# Patient Record
Sex: Male | Born: 1960 | Race: White | Hispanic: No | Marital: Married | Smoking: Never smoker
Health system: Southern US, Community
[De-identification: ages and names within clinical notes are randomized; demographics above are authoritative.]

## PROBLEM LIST (undated history)

## (undated) DIAGNOSIS — E785 Hyperlipidemia, unspecified: Secondary | ICD-10-CM

## (undated) DIAGNOSIS — L301 Dyshidrosis [pompholyx]: Secondary | ICD-10-CM

## (undated) DIAGNOSIS — G4700 Insomnia, unspecified: Secondary | ICD-10-CM

## (undated) HISTORY — PX: HEEL SPUR SURGERY: SHX665

## (undated) HISTORY — DX: Hyperlipidemia, unspecified: E78.5

## (undated) HISTORY — PX: VASECTOMY: SHX75

## (undated) HISTORY — DX: Dyshidrosis (pompholyx): L30.1

## (undated) HISTORY — DX: Insomnia, unspecified: G47.00

---

## 2007-08-20 HISTORY — PX: SHOULDER ARTHROSCOPY: SHX128

## 2007-11-12 ENCOUNTER — Encounter: Payer: Self-pay | Admitting: Family Medicine

## 2007-12-21 ENCOUNTER — Encounter: Payer: Self-pay | Admitting: Family Medicine

## 2008-11-28 ENCOUNTER — Ambulatory Visit: Payer: Self-pay | Admitting: Family Medicine

## 2008-11-28 DIAGNOSIS — G4700 Insomnia, unspecified: Secondary | ICD-10-CM

## 2008-11-28 DIAGNOSIS — E785 Hyperlipidemia, unspecified: Secondary | ICD-10-CM

## 2008-11-28 HISTORY — DX: Hyperlipidemia, unspecified: E78.5

## 2008-11-28 HISTORY — DX: Insomnia, unspecified: G47.00

## 2008-11-29 ENCOUNTER — Telehealth: Payer: Self-pay | Admitting: Family Medicine

## 2009-01-12 ENCOUNTER — Encounter: Payer: Self-pay | Admitting: Family Medicine

## 2009-03-03 ENCOUNTER — Ambulatory Visit: Payer: Self-pay | Admitting: Family Medicine

## 2009-03-03 LAB — CONVERTED CEMR LAB
ALT: 24 units/L (ref 0–53)
Alkaline Phosphatase: 50 units/L (ref 39–117)
BUN: 12 mg/dL (ref 6–23)
Basophils Relative: 0.4 % (ref 0.0–3.0)
Bilirubin Urine: NEGATIVE
Bilirubin, Direct: 0.1 mg/dL (ref 0.0–0.3)
CO2: 29 meq/L (ref 19–32)
Chloride: 110 meq/L (ref 96–112)
Eosinophils Relative: 3.6 % (ref 0.0–5.0)
Glucose, Bld: 94 mg/dL (ref 70–99)
LDL Cholesterol: 97 mg/dL (ref 0–99)
Lymphocytes Relative: 43 % (ref 12.0–46.0)
Neutrophils Relative %: 44.9 % (ref 43.0–77.0)
Platelets: 198 10*3/uL (ref 150.0–400.0)
Potassium: 4 meq/L (ref 3.5–5.1)
RBC: 4.68 M/uL (ref 4.22–5.81)
Total Bilirubin: 0.8 mg/dL (ref 0.3–1.2)
Total CHOL/HDL Ratio: 4
Total Protein, Urine: NEGATIVE mg/dL
Total Protein: 7.2 g/dL (ref 6.0–8.3)
Urine Glucose: NEGATIVE mg/dL
VLDL: 7.8 mg/dL (ref 0.0–40.0)
WBC: 4 10*3/uL — ABNORMAL LOW (ref 4.5–10.5)
pH: 7 (ref 5.0–8.0)

## 2009-03-27 ENCOUNTER — Ambulatory Visit: Payer: Self-pay | Admitting: Family Medicine

## 2009-03-27 DIAGNOSIS — L301 Dyshidrosis [pompholyx]: Secondary | ICD-10-CM | POA: Insufficient documentation

## 2009-03-27 HISTORY — DX: Dyshidrosis (pompholyx): L30.1

## 2009-03-31 ENCOUNTER — Ambulatory Visit: Payer: Self-pay | Admitting: Family Medicine

## 2009-04-03 LAB — CONVERTED CEMR LAB
BUN: 14 mg/dL (ref 6–23)
CRP, High Sensitivity: 0.2
Ferritin: 216 ng/mL (ref 22–322)
Iron: 129 ug/dL (ref 42–165)
Magnesium: 2 mg/dL (ref 1.5–2.5)

## 2010-02-06 ENCOUNTER — Encounter: Payer: Self-pay | Admitting: Family Medicine

## 2010-05-08 ENCOUNTER — Ambulatory Visit: Payer: Self-pay | Admitting: Family Medicine

## 2010-05-08 LAB — CONVERTED CEMR LAB
Bilirubin Urine: NEGATIVE
Ketones, urine, test strip: NEGATIVE
Nitrite: NEGATIVE
Protein, U semiquant: NEGATIVE
Urobilinogen, UA: 0.2

## 2010-05-10 LAB — CONVERTED CEMR LAB
ALT: 26 units/L (ref 0–53)
Albumin: 4.3 g/dL (ref 3.5–5.2)
Basophils Relative: 0.4 % (ref 0.0–3.0)
Bilirubin, Direct: 0.2 mg/dL (ref 0.0–0.3)
CO2: 26 meq/L (ref 19–32)
Calcium: 9.5 mg/dL (ref 8.4–10.5)
Chloride: 105 meq/L (ref 96–112)
Creatinine, Ser: 0.8 mg/dL (ref 0.4–1.5)
Eosinophils Absolute: 0.1 10*3/uL (ref 0.0–0.7)
Eosinophils Relative: 2.4 % (ref 0.0–5.0)
Glucose, Bld: 95 mg/dL (ref 70–99)
HCT: 43.9 % (ref 39.0–52.0)
HDL: 42.2 mg/dL (ref >39.00)
Hemoglobin: 15 g/dL (ref 13.0–17.0)
Lymphs Abs: 1.8 10*3/uL (ref 0.7–4.0)
MCHC: 34.2 g/dL (ref 30.0–36.0)
MCV: 90.2 fL (ref 78.0–100.0)
Monocytes Absolute: 0.3 10*3/uL (ref 0.1–1.0)
Neutro Abs: 1.6 10*3/uL (ref 1.4–7.7)
Neutrophils Relative %: 41.9 % — ABNORMAL LOW (ref 43.0–77.0)
RBC: 4.87 M/uL (ref 4.22–5.81)
TSH: 0.56 microintl units/mL (ref 0.35–5.50)
Total CHOL/HDL Ratio: 4
Total Protein: 6.8 g/dL (ref 6.0–8.3)
Triglycerides: 76 mg/dL (ref 0.0–149.0)
WBC: 3.8 10*3/uL — ABNORMAL LOW (ref 4.5–10.5)

## 2010-05-14 ENCOUNTER — Telehealth: Payer: Self-pay | Admitting: *Deleted

## 2010-06-08 ENCOUNTER — Telehealth: Payer: Self-pay | Admitting: Family Medicine

## 2010-06-22 ENCOUNTER — Ambulatory Visit: Payer: Self-pay

## 2010-06-22 ENCOUNTER — Ambulatory Visit: Payer: Self-pay | Admitting: Cardiology

## 2010-06-26 ENCOUNTER — Encounter: Payer: Self-pay | Admitting: Family Medicine

## 2010-06-26 ENCOUNTER — Ambulatory Visit: Payer: Self-pay | Admitting: Family Medicine

## 2010-07-06 ENCOUNTER — Encounter (INDEPENDENT_AMBULATORY_CARE_PROVIDER_SITE_OTHER): Payer: Self-pay | Admitting: *Deleted

## 2010-09-12 ENCOUNTER — Encounter (INDEPENDENT_AMBULATORY_CARE_PROVIDER_SITE_OTHER): Payer: Self-pay | Admitting: *Deleted

## 2010-09-14 ENCOUNTER — Ambulatory Visit
Admission: RE | Admit: 2010-09-14 | Discharge: 2010-09-14 | Payer: Self-pay | Source: Home / Self Care | Attending: Internal Medicine | Admitting: Internal Medicine

## 2010-09-18 NOTE — Assessment & Plan Note (Signed)
Summary: PFT, EKG, audiometry, executive physical additions/nn  Nurse Visit   Vital Signs:  Patient profile:   50 year old male Temp:     98.0 degrees F oral  Vitals Entered By: Sid Falcon LPN (June 26, 2010 1:21 PM)  Hearing Screen  20db HL: Left  Right  Audiometry Comment: Heard all frequencies   Hearing Testing Entered By: Sid Falcon LPN (June 26, 2010 1:30 PM) 25db HL: Left  Right  Audiometry Comment: heard all frequencies  40db HL: Left  Right  Audiometry Comment: heard all frequencies    Allergies: No Known Drug Allergies  Orders Added: 1)  Audiometry [92552] 2)  Spirometry w/Graph [[address]]

## 2010-09-18 NOTE — Progress Notes (Signed)
Summary: Pt requesting additional Executive Physical testing  Phone Note Call from Patient   Caller: Patient Call For: Evelena Peat MD Summary of Call: Pt was here for CPX with Dr Caryl Never.  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  Left a message at pt home and cell phone to call and schedule these additional testing, preferable at Tuesday around noon as it is our Flex day, nurse visit with Harriett Sine Initial call taken by: Sid Falcon LPN,  June 08, 2010 3:22 PM  Follow-up for Phone Call        Pt called back, scheuled for Nov 8th Follow-up by: Sid Falcon LPN,  June 15, 2010 10:56 AM

## 2010-09-18 NOTE — Letter (Signed)
Summary: Pre Visit Letter Revised  [facility]  [address] [place], [place] [address]   Phone: [phone]  Fax: [phone]        07/06/2010 MRN: [id number] Barry King [address] [place], [place]  [address]             Procedure Date:  09/21/2010  Welcome to the Gastroenterology Division at [facility].    You are scheduled to see a nurse for your pre-procedure visit on 09/14/2010 at 4:30PM on the 3rd floor at [facility], [address]  We ask that you try to arrive at our office 15 minutes prior to your appointment time to allow for check-in.  Please take a minute to review the attached form.  If you answer "Yes" to one or more of the questions on the first page, we ask that you call the person listed at your earliest opportunity.  If you answer "No" to all of the questions, please complete the rest of the form and bring it to your appointment.    Your nurse visit will consist of discussing your medical and surgical history, your immediate family medical history, and your medications.   If you are unable to list all of your medications on the form, please bring the medication bottles to your appointment and we will list them.  We will need to be aware of both prescribed and over the counter drugs.  We will need to know exact dosage information as well.    Please be prepared to read and sign documents such as consent forms, a financial agreement, and acknowledgement forms.  If necessary, and with your consent, a friend or relative is welcome to sit-in on the nurse visit with you.  Please bring your insurance card so that we may make a copy of it.  If your insurance requires a referral to see a specialist, please bring your referral form from your primary care physician.  No co-pay is required for this nurse visit.     If you cannot keep your appointment, please call [phone] to cancel or reschedule prior to your appointment date.  This  allows [place] the opportunity to schedule an appointment for another patient in need of care.    Thank you for choosing [facility] for your medical needs.  We appreciate the opportunity to care for you.  Please visit [place] at our website  to learn more about our practice.  Sincerely, The Gastroenterology Division

## 2010-09-18 NOTE — Progress Notes (Signed)
Summary: lab copy  Phone Note Call from Patient Call back at [phone]   Call For: Mariam Helbert Summary of Call: Wants copy of labs faxed [phone] or mailed.  Got results Fri, but he wants to see actual numbers.   Initial call taken by: Rudy Jew, RN,  May 14, 2010 8:08 AM  Follow-up for Phone Call        Labs will be mailed as requested Follow-up by: Sid Falcon LPN,  May 14, 2010 8:56 AM

## 2010-09-18 NOTE — Assessment & Plan Note (Signed)
Summary: CPX/PT WILL COME IN FASTING/NJR   Vital Signs:  Patient profile:   50 year old male Height:      77 inches Weight:      203 pounds BMI:     24.16 O2 Sat:      98 % Temp:     98.2 degrees F oral Pulse rate:   61 / minute Pulse rhythm:   regular Resp:     12 per minute BP sitting:   120 / 78  Vitals Entered By: Lynann Beaver CMA (May 08, 2010 9:00 AM)  CC: cpx Is Patient Diabetic? No Pain Assessment Patient in pain? no        History of Present Illness: Here for CPE.  Has made significant lifestyle changes since last visit-predominantly with more exercise.  Very stressfull job.  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  Handling stress more effectively.  Still has some sleep difficulty at times.  Signif travel related to work.  He brings in copy of recommended items for "executive physical" that his company recommends.  This includes ETT, audiometry, EKG, Spirometry, Colonoscopy, in addition to  the usual labs.  Clinical Review Panels:  Prevention   Last PSA:  0.6 (12/21/2007)  Immunizations   Last Tetanus Booster:  Historical (08/19/2004)  Lipid Management   Cholesterol:  144 (03/03/2009)   LDL (bad choesterol):  97 (03/03/2009)   HDL (good cholesterol):  39.00 (03/03/2009)  Diabetes Management   Creatinine:  0.8 (03/03/2009)  CBC   WBC:  4.0 (03/03/2009)   RBC:  4.68 (03/03/2009)   Hgb:  14.6 (03/03/2009)   Hct:  41.9 (03/03/2009)   Platelets:  198.0 (03/03/2009)   MCV  89.5 (03/03/2009)   MCHC  34.7 (03/03/2009)   RDW  12.6 (03/03/2009)   PMN:  44.9 (03/03/2009)   Lymphs:  43.0 (03/03/2009)   Monos:  8.1 (03/03/2009)   Eosinophils:  3.6 (03/03/2009)   Basophil:  0.4 (03/03/2009)  Complete Metabolic Panel   Glucose:  94 (03/03/2009)   Sodium:  143 (03/03/2009)   Potassium:  4.0 (03/03/2009)   Chloride:  110 (03/03/2009)   CO2:  29 (03/03/2009)   BUN:  14 (03/31/2009)   Creatinine:  0.8 (03/03/2009)   Albumin:  4.1 (03/03/2009)   Total  Protein:  7.2 (03/03/2009)   Calcium:  9.2 (03/03/2009)   Total Bili:  0.8 (03/03/2009)   Alk Phos:  50 (03/03/2009)   SGPT (ALT):  24 (03/03/2009)   SGOT (AST):  18 (03/03/2009)   Current Medications (verified): 1)  Lipitor 10 Mg Tabs (Atorvastatin Calcium) .... One Tab Daily 2)  Zolpidem Tartrate 10 Mg Tabs (Zolpidem Tartrate) .... One By Mouth Q Hs As Needed Insomnia  Allergies (verified): No Known Drug Allergies  Past History:  Past Medical History: Last updated: 01/12/2009 Hyperlipidemia Kidney stones chronic intermittent insomnia  Past Surgical History: Last updated: 03/27/2009 Left shoulder, arthroscopic 2009 Right knee, arthroscopic 2007 Vasectomy right foot spur 2010  Family History: Last updated: 05/08/2010 Family History of Cardiovascular disorder Prostate CA, father MI 30 Family History Uterine cancer  mother stroke--mother age 34  Social History: Last updated: 01/12/2009 Occupation:  Holiday representative VP Married Alcohol use-yes Regular exercise-yes Never Smoked  Risk Factors: Exercise: yes (11/28/2008)  Risk Factors: Smoking Status: never (01/12/2009) PMH-FH-SH reviewed for relevance  Family History: Family History of Cardiovascular disorder Prostate CA, father MI 73 Family History Uterine cancer  mother stroke--mother age 85  Review of Systems  The patient denies anorexia, fever, weight gain, vision loss,  decreased hearing, hoarseness, chest pain, syncope, dyspnea on exertion, peripheral edema, prolonged cough, headaches, hemoptysis, abdominal pain, melena, hematochezia, severe indigestion/heartburn, hematuria, incontinence, muscle weakness, suspicious skin lesions, depression, and enlarged lymph nodes.         Has lost some weight sec to his efforts.  Physical Exam  General:  Well-developed,well-nourished,in no acute distress; alert,appropriate and cooperative throughout examination Eyes:  pupils equal, pupils round, and pupils reactive to light.    Ears:  External ear exam shows no significant lesions or deformities.  Otoscopic examination reveals clear canals, tympanic membranes are intact bilaterally without bulging, retraction, inflammation or discharge. Hearing is grossly normal bilaterally. Mouth:  Oral mucosa and oropharynx without lesions or exudates.  Teeth in good repair. Neck:  No deformities, masses, or tenderness noted. Lungs:  Normal respiratory effort, chest expands symmetrically. Lungs are clear to auscultation, no crackles or wheezes. Heart:  Normal rate and regular rhythm. S1 and S2 normal without gallop, murmur, click, rub or other extra sounds. Abdomen:  Bowel sounds positive,abdomen soft and non-tender without masses, organomegaly or hernias noted. Rectal:  No external abnormalities noted. Normal sphincter tone. No rectal masses or tenderness. Prostate:  Prostate gland firm and smooth, no enlargement, nodularity, tenderness, mass, asymmetry or induration. Msk:  No deformity or scoliosis noted of thoracic or lumbar spine.   Extremities:  No clubbing, cyanosis, edema, or deformity noted with normal full range of motion of all joints.   Neurologic:  alert & oriented X3, cranial nerves II-XII intact, strength normal in all extremities, and gait normal.   Skin:  no rashes and no suspicious lesions.   Cervical Nodes:  No lymphadenopathy noted Psych:  normally interactive, good eye contact, not anxious appearing, and not depressed appearing.     Impression & Recommendations:  Problem # 1:  ROUTINE GENERAL MEDICAL EXAM@HEALTH  CARE FACL (ICD-V70.0) schedule screening labs.  Set up colonoscopy after he turn 49 in Nov.  Other testing as per HPI. Orders: TLB-Lipid Panel (80061-LIPID) TLB-BMP (Basic Metabolic Panel-BMET) (80048-METABOL) TLB-CBC Platelet - w/Differential (85025-CBCD) TLB-Hepatic/Liver Function Pnl (80076-HEPATIC) TLB-TSH (Thyroid Stimulating Hormone) (84443-TSH) UA Dipstick w/o Micro (automated)   (81003) TLB-PSA (Prostate Specific Antigen) (84153-PSA) Specimen Handling ([address]) Venipuncture ([address]) Gastroenterology Referral (GI) Cardiology Referral (Cardiology)  Complete Medication List: 1)  Lipitor 10 Mg Tabs (Atorvastatin calcium) .... One tab daily 2)  Zolpidem Tartrate 10 Mg Tabs (Zolpidem tartrate) .... One by mouth q hs as needed insomnia Prescriptions: ZOLPIDEM TARTRATE 10 MG TABS (ZOLPIDEM TARTRATE) one by mouth q hs as needed insomnia  #30 x 2   Entered and Authorized by:   Evelena Peat MD   Signed by:   Evelena Peat MD on 05/08/2010   Method used:   Print then Give to Patient   RxID:   [id number] LIPITOR 10 MG TABS (ATORVASTATIN CALCIUM) one tab daily  #90 Tablet x 3   Entered and Authorized by:   Evelena Peat MD   Signed by:   Evelena Peat MD on 05/08/2010   Method used:   Electronically to        [facility]* (retail)             ,          Ph: [phone]       Fax: [phone]   RxID:   [id number]    Laboratory Results   Urine Tests  Date/Time Recieved: May 08, 2010 10:50 AM  Date/Time Reported: May 08, 2010 10:50 AM   Routine Urinalysis  Color: yellow Appearance: Clear Glucose: negative   (Normal Range: Negative) Bilirubin: negative   (Normal Range: Negative) Ketone: negative   (Normal Range: Negative) Spec. Gravity: 1.010   (Normal Range: 1.003-1.035) Blood: negative   (Normal Range: Negative) pH: 7.5   (Normal Range: 5.0-8.0) Protein: negative   (Normal Range: Negative) Urobilinogen: 0.2   (Normal Range: 0-1) Nitrite: negative   (Normal Range: Negative) Leukocyte Esterace: trace   (Normal Range: Negative)    Comments: Wynona Canes, CMA  May 08, 2010 10:50 AM

## 2010-09-18 NOTE — Letter (Signed)
Summary: Generic Letter  [place] at [facility]  [address] [place], [place] [address]   Phone: [phone]  Fax: [phone]    06/26/2010  Regarding:  Barry King [address] [place], [place]  [address]  To Whom it May Concern,  Barry King had a recent complete physical examination through our office.  Screening labs including chemistries, CBC, PSA, TSH, urinalysis, and hepatic function were basically normal.  Spirometry, audiometry, EKG, and exercise tolerance tests were all normal.  Colonoscopy has been scheduled.      Sincerely,   Evelena Peat MD

## 2010-09-18 NOTE — Therapy (Signed)
Summary: Hearing Test/[facility]  Hearing Test/[facility]   Imported By: Maryln Gottron 07/02/2010 10:47:26  _____________________________________________________________________  External Attachment:    Type:   Image     Comment:   External Document

## 2010-09-20 NOTE — Miscellaneous (Signed)
Summary: previsit prep/rm  Clinical Lists Changes  Medications: Added new medication of MOVIPREP 100 GM  SOLR (PEG-KCL-NACL-NASULF-NA ASC-C) As per prep instructions. - Signed Rx of MOVIPREP 100 GM  SOLR (PEG-KCL-NACL-NASULF-NA ASC-C) As per prep instructions.;  #1 x 0;  Signed;  Entered by: Sherren Kerns RN;  Authorized by: Hilarie Fredrickson MD;  Method used: [facility]  [place] [address], [place], [place]  [address], Ph: [phone] or [phone], Fax: [phone] Observations: Added new observation of ALLERGY REV: Done (09/14/2010 16:01) Added new observation of NKA: T (09/14/2010 16:01)    Prescriptions: MOVIPREP 100 GM  SOLR (PEG-KCL-NACL-NASULF-NA ASC-C) As per prep instructions.  #1 x 0   Entered by:   Sherren Kerns RN   Authorized by:   Hilarie Fredrickson MD   Signed by:   Sherren Kerns RN on 09/14/2010   Method used:   [facility]        [facility]  [place] [address] (retail)       [address] [place] [address]       [place], [place]  [address]       Ph: [phone] or [phone]       Fax: [phone]   RxID:   [id number]

## 2010-09-20 NOTE — Letter (Signed)
Summary: [facility] Instructions  [facility]  [address] [place], [place] [address]   Phone: [phone]  Fax: [phone]       DAVIEON STOCKHAM    01/24/1961    MRN: [id number]        Procedure Day /Date: Friday 09/21/10     Arrival Time: 7:30 am      Procedure Time: 8:30 am     Location of Procedure:                    _X_  [facility] (4th Floor)       PREPARATION FOR COLONOSCOPY WITH MOVIPREP   Starting 5 days prior to your procedure  Sunday 09/16/10  do not eat nuts, seeds, popcorn, corn, beans, peas,  salads, or any raw vegetables.  Do not take any fiber supplements (e.g. Metamucil, Citrucel, and Benefiber).  THE DAY BEFORE YOUR PROCEDURE         DATE: 09/20/10    DAY: Thursday    1.  Drink clear liquids the entire day-NO SOLID FOOD  2.  Do not drink anything colored red or purple.  Avoid juices with pulp.  No orange juice.  3.  Drink at least 64 oz. (8 glasses) of fluid/clear liquids during the day to prevent dehydration and help the prep work efficiently.  CLEAR LIQUIDS INCLUDE: Water Jello Ice Popsicles Tea (sugar ok, no milk/cream) Powdered fruit flavored drinks Coffee (sugar ok, no milk/cream) Gatorade Juice: apple, white grape, white cranberry  Lemonade Clear bullion, consomm, broth Carbonated beverages (any kind) Strained chicken noodle soup Hard Candy                             4.  In the morning, mix first dose of MoviPrep solution:    Empty 1 Pouch A and 1 Pouch B into the disposable container    Add lukewarm drinking water to the top line of the container. Mix to dissolve    Refrigerate (mixed solution should be used within 24 hrs)  5.  Begin drinking the prep at 5:00 p.m. The MoviPrep container is divided by 4 marks.   Every 15 minutes drink the solution down to the next mark (approximately 8 oz) until the full liter is complete.   6.  Follow completed prep with 16 oz of clear liquid of your choice (Nothing red or  purple).  Continue to drink clear liquids until bedtime.  7.  Before going to bed, mix second dose of MoviPrep solution:    Empty 1 Pouch A and 1 Pouch B into the disposable container    Add lukewarm drinking water to the top line of the container. Mix to dissolve    Refrigerate  THE DAY OF YOUR PROCEDURE      DATE: 09/21/10   DAY: Friday   Beginning at 3:30 a.m. (5 hours before procedure):         1. Every 15 minutes, drink the solution down to the next mark (approx 8 oz) until the full liter is complete.  2. Follow completed prep with 16 oz. of clear liquid of your choice.    3. You may drink clear liquids until 6:30 am  (2 HOURS BEFORE PROCEDURE).   MEDICATION INSTRUCTIONS  Unless otherwise instructed, you should take regular prescription medications with a small sip of water   as early as possible the morning of your procedure.  OTHER INSTRUCTIONS  You will need a responsible adult at least 51 years of age to accompany you and drive you home.   This person must remain in the waiting room during your procedure.  Wear loose fitting clothing that is easily removed.  Leave jewelry and other valuables at home.  However, you may wish to bring a book to read or  an iPod/MP3 player to listen to music as you wait for your procedure to start.  Remove all body piercing jewelry and leave at home.  Total time from sign-in until discharge is approximately 2-3 hours.  You should go home directly after your procedure and rest.  You can resume normal activities the  day after your procedure.  The day of your procedure you should not:   Drive   Make legal decisions   Operate machinery   Drink alcohol   Return to work  You will receive specific instructions about eating, activities and medications before you leave.    The above instructions have been reviewed and explained to me by  Sherren Kerns RN  September 14, 2010 4:40 PM    I fully understand and can  verbalize these instructions _____________________________ Date _________

## 2010-09-21 ENCOUNTER — Other Ambulatory Visit (AMBULATORY_SURGERY_CENTER): Payer: BC Managed Care – PPO | Admitting: Internal Medicine

## 2010-09-21 ENCOUNTER — Other Ambulatory Visit: Payer: Self-pay | Admitting: Internal Medicine

## 2010-09-21 ENCOUNTER — Ambulatory Visit: Admit: 2010-09-21 | Payer: Self-pay | Admitting: Internal Medicine

## 2010-09-21 DIAGNOSIS — D126 Benign neoplasm of colon, unspecified: Secondary | ICD-10-CM

## 2010-09-21 DIAGNOSIS — K5730 Diverticulosis of large intestine without perforation or abscess without bleeding: Secondary | ICD-10-CM

## 2010-09-21 DIAGNOSIS — Z1211 Encounter for screening for malignant neoplasm of colon: Secondary | ICD-10-CM

## 2010-09-25 ENCOUNTER — Encounter: Payer: Self-pay | Admitting: Internal Medicine

## 2010-10-04 NOTE — Letter (Signed)
Summary: Patient Notice- Polyp Results  [facility]  [address] [place], [place] [address]   Phone: [phone]  Fax: [phone]        September 25, 2010 MRN: [id number]    [facility] [address] [place], [place]  [address]    Dear Mr. Scala,  I am pleased to inform you that the colon polyp(s) removed during your recent colonoscopy was (were) found to be benign (no cancer detected) upon pathologic examination.  I recommend you have a repeat colonoscopy examination in 5 years to look for recurrent polyps, as having colon polyps increases your risk for having recurrent polyps or even colon cancer in the future.  Should you develop new or worsening symptoms of abdominal pain, bowel habit changes or bleeding from the rectum or bowels, please schedule an evaluation with either your primary care physician or with me.  Additional information/recommendations:  __ No further action with gastroenterology is needed at this time. Please      follow-up with your primary care physician for your other healthcare      needs.   Please call us if you are having persistent problems or have questions about your condition that have not been fully answered at this time.  Sincerely,  Hilarie Fredrickson MD  This letter has been electronically signed by your physician.  Appended Document: Patient Notice- Polyp Results LETTER MAILED

## 2010-10-04 NOTE — Procedures (Addendum)
Summary: Colonoscopy   Colonoscopy  Procedure date:  09/21/2010  Findings:      Location:  [facility].   COLONOSCOPY PROCEDURE REPORT  PATIENT:  Barry King, Barry King  MR#:  [id number] BIRTHDATE:   [birth year], 49 yrs. old   GENDER:   male ENDOSCOPIST:   Wilhemina Bonito. Eda Keys, MD REF. BY: Evelena Peat, M.D. PROCEDURE DATE:  09/21/2010 PROCEDURE:  Colonoscopy with snare polypectomy x 4 ASA CLASS:   Class II INDICATIONS: Routine Risk Screening  MEDICATIONS:    Fentanyl 100 mcg IV, Versed 10 mg IV, Benadryl 12.5 mg IV  DESCRIPTION OF PROCEDURE:   After the risks benefits and alternatives of the procedure were thoroughly explained, informed consent was obtained.  Digital rectal exam was performed and revealed no abnormalities.   The LB 180AL [id number] endoscope was introduced through the anus and advanced to the cecum, which was identified by both the appendix and ileocecal valve, without limitations.Time to cecum = 4:07 min.  The quality of the prep was excellent, using MoviPrep.  The instrument was then slowly withdrawn (time = 12:53 min) as the colon was fully examined. <<PROCEDUREIMAGES>>            <<OLD IMAGES>>  FINDINGS:  Four polyps were found - 4mm and 8mm sessile in the ascending colon; 5mm pedunculated in the descending colon, and 5mm in the sigmoid colon. Polyps were snared without cautery. Retrieval was successful.   Mild diverticulosis was found in the sigmoid colon.  Otherwise normal colonoscopy without other polyps, masses, vascular ectasias, or inflammatory changes.   Retroflexed views in the rectum revealed no abnormalities.    The scope was then withdrawn from the patient and the procedure completed.  COMPLICATIONS:   None ENDOSCOPIC IMPRESSION:  1) Four polyps - removed  2) Mild diverticulosis in the sigmoid colon  3) Otherwise nl colonoscopy   RECOMMENDATIONS:  1) Follow up colonoscopy in 3 years if 3 or more adenomas; otherwise 5  years _______________________________ Wilhemina Bonito. Eda Keys, MD  CC: Evelena Peat, MD; The Patient     Appended Document: Colonoscopy recall     Procedures Next Due Date:    Colonoscopy: 09/2015

## 2010-10-17 LAB — HM COLONOSCOPY

## 2010-11-17 ENCOUNTER — Emergency Department (HOSPITAL_COMMUNITY): Payer: BC Managed Care – PPO

## 2010-11-17 ENCOUNTER — Emergency Department (HOSPITAL_COMMUNITY)
Admission: EM | Admit: 2010-11-17 | Discharge: 2010-11-17 | Disposition: A | Discharge status: Home / Self Care | Payer: BC Managed Care – PPO | Attending: Emergency Medicine | Admitting: Emergency Medicine

## 2010-11-17 DIAGNOSIS — S62233A Other displaced fracture of base of first metacarpal bone, unspecified hand, initial encounter for closed fracture: Secondary | ICD-10-CM | POA: Insufficient documentation

## 2010-11-17 DIAGNOSIS — W64XXXA Exposure to other animate mechanical forces, initial encounter: Secondary | ICD-10-CM | POA: Insufficient documentation

## 2010-11-17 DIAGNOSIS — E7800 Pure hypercholesterolemia, unspecified: Secondary | ICD-10-CM | POA: Insufficient documentation

## 2010-11-21 ENCOUNTER — Ambulatory Visit (HOSPITAL_BASED_OUTPATIENT_CLINIC_OR_DEPARTMENT_OTHER)
Admission: RE | Admit: 2010-11-21 | Discharge: 2010-11-21 | Disposition: A | Discharge status: Home / Self Care | Payer: BC Managed Care – PPO | Source: Ambulatory Visit | Attending: Orthopedic Surgery | Admitting: Orthopedic Surgery

## 2010-11-21 DIAGNOSIS — S62233A Other displaced fracture of base of first metacarpal bone, unspecified hand, initial encounter for closed fracture: Secondary | ICD-10-CM | POA: Insufficient documentation

## 2010-11-21 DIAGNOSIS — X58XXXA Exposure to other specified factors, initial encounter: Secondary | ICD-10-CM | POA: Insufficient documentation

## 2010-11-21 LAB — POCT HEMOGLOBIN-HEMACUE: Hemoglobin: 15.5 g/dL (ref 13.0–17.0)

## 2010-11-29 NOTE — Op Note (Signed)
  NAMEBUCKY, Barry King NO.:  [id number]  MEDICAL RECORD NO.:  [id number]           PATIENT TYPE:  LOCATION:                                 FACILITY:  PHYSICIAN:  Artist Pais. Yannet Rincon, M.D.DATE OF BIRTH:  [birth year]  DATE OF PROCEDURE:  11/21/2010 DATE OF DISCHARGE:                              OPERATIVE REPORT   PREOPERATIVE DIAGNOSIS:  Displaced thumb metacarpal fracture, left side.  POSTOPERATIVE DIAGNOSIS:  Displaced thumb metacarpal fracture, left side.  PROCEDURE:  Closed reduction and percutaneous pinning above with 0.045 K- wires x3.  SURGEON:  Artist Pais. Mina Marble, MD  ASSISTANT:  None.  ANESTHESIA:  General.  TOURNIQUET TIME:  Eighty minutes.  No complication.  No drains.  The patient was taken to the operating suite.  After the induction of adequate general anesthesia, left upper extremity was prepped and draped in sterile fashion.  An Esmarch was used to exsanguinate the limb. Tourniquet was then inflated to 250 mmHg.  At this point in time, closed reduction was performed by longitudinal traction, pronation, and downward pressure on the thumb metacarpal base.  Once reduction was obtained, an 0.045 K-wires driven from radial to ulnar across the fracture site under direct and fluoroscopic guidance, a second was placed radially, and a third one ulnarly, all across the fracture site but not crossing the [facility] joint.  Intraoperative fluoroscopy revealed adequate reduction in AP, lateral, and oblique view.  The K-wire was cut outside the skin, bent upon themselves, dressed with Xeroform.  The patient was placed in a sterile dressing of 4 x 4 fluffs and a radial gutter splint.  The patient tolerated the procedure well and went to recovery in stable fashion.     Artist Pais Mina Marble, M.D.     MAW/MEDQ  D:  11/21/2010  T:  11/22/2010  Job:  [id number]  Electronically Signed by Dairl Ponder M.D. on 11/29/2010 03:06:43 PM

## 2011-04-15 ENCOUNTER — Other Ambulatory Visit: Payer: Self-pay | Admitting: *Deleted

## 2011-04-15 NOTE — Telephone Encounter (Signed)
OK to refill ambien #90 once

## 2011-04-15 NOTE — Telephone Encounter (Signed)
Note from pt requesting refill from Ambien 10mg . to [facility], requesting #90 with year refills.  Pt last OV was 9/11.  I spoke with pt, reminded him of need for return OV, and asked him for a local pharmacy to call in #30.  Pt explained his insurance would not be happy? Requesting OK to fill Ambien #90, 0 refills for [facility] and pt agreed to schedule return OV soon.

## 2011-04-16 MED ORDER — ZOLPIDEM TARTRATE 10 MG PO TABS: 10.0000 mg | ORAL_TABLET | Freq: Every evening | ORAL | Status: DC | PRN

## 2011-04-16 NOTE — Telephone Encounter (Signed)
Pt wife aware #90 sent to [facility] and he will need to be seen for future refills KIK

## 2011-04-18 ENCOUNTER — Other Ambulatory Visit: Payer: Self-pay | Admitting: *Deleted

## 2011-04-18 MED ORDER — ZOLPIDEM TARTRATE 10 MG PO TABS: 10.0000 mg | ORAL_TABLET | Freq: Every evening | ORAL | Status: DC | PRN

## 2011-04-18 NOTE — Telephone Encounter (Signed)
Per Dr Caryl Never, OK to fill Ambien 10 mg #90 to [facility], 0 refills as pt does need return OV.  Rx faxed, confirmation received, pt wife informed

## 2011-06-17 ENCOUNTER — Ambulatory Visit (INDEPENDENT_AMBULATORY_CARE_PROVIDER_SITE_OTHER): Payer: BC Managed Care – PPO | Admitting: Family Medicine

## 2011-06-17 ENCOUNTER — Encounter: Payer: Self-pay | Admitting: Family Medicine

## 2011-06-17 VITALS — BP 128/76 | HR 60 | Temp 98.5°F | Resp 12 | Ht 77.0 in | Wt 205.0 lb

## 2011-06-17 DIAGNOSIS — Z0000 Encounter for general adult medical examination without abnormal findings: Secondary | ICD-10-CM

## 2011-06-17 LAB — CBC WITH DIFFERENTIAL/PLATELET
Basophils Absolute: 0 10*3/uL (ref 0.0–0.1)
Basophils Relative: 0.3 % (ref 0.0–3.0)
HCT: 41.5 % (ref 39.0–52.0)
Hemoglobin: 14.2 g/dL (ref 13.0–17.0)
Lymphs Abs: 1.7 10*3/uL (ref 0.7–4.0)
MCHC: 34.2 g/dL (ref 30.0–36.0)
Monocytes Relative: 7.6 % (ref 3.0–12.0)
Neutro Abs: 1.6 10*3/uL (ref 1.4–7.7)
RDW: 13.4 % (ref 11.5–14.6)

## 2011-06-17 LAB — LIPID PANEL
Cholesterol: 139 mg/dL (ref 0–200)
Total CHOL/HDL Ratio: 4
Triglycerides: 38 mg/dL (ref 0.0–149.0)

## 2011-06-17 LAB — HEPATIC FUNCTION PANEL
ALT: 22 U/L (ref 0–53)
Albumin: 4.2 g/dL (ref 3.5–5.2)
Alkaline Phosphatase: 48 U/L (ref 39–117)
Total Protein: 6.6 g/dL (ref 6.0–8.3)

## 2011-06-17 LAB — BASIC METABOLIC PANEL
Calcium: 9.3 mg/dL (ref 8.4–10.5)
Creatinine, Ser: 0.8 mg/dL (ref 0.4–1.5)

## 2011-06-17 MED ORDER — ATORVASTATIN CALCIUM 10 MG PO TABS: 10.0000 mg | ORAL_TABLET | Freq: Every day | ORAL | Status: DC

## 2011-06-17 NOTE — Progress Notes (Signed)
  Subjective:    Patient ID: Barry King, male    DOB: Apr 02, 1961, 50 y.o.   MRN: [id number]  HPI  Patient is seen for complete physical. Continues to exercise regularly. Takes Lipitor for hyperlipidemia but no other regular medications. Immunizations reviewed and up-to-date. Very stressful work in the past year. 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 Had colonoscopy last year or which showed benign polyps.  Past Medical History  Diagnosis Date  . HYPERLIPIDEMIA 11/28/2008  . 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 Shoulder arthroscopy 2009  . Vasectomy   . Heel spur surgery     reports that he has never smoked. He does not have any smokeless tobacco history on file. His alcohol and drug histories not on file. family history includes Cancer in his father and mother; Heart disease in his father; and Stroke (age of onset:85) in his mother. No Known Allergies   Review of Systems  Constitutional: Negative for fever, activity change, appetite change and fatigue.  HENT: Negative for ear pain, congestion and trouble swallowing.   Eyes: Negative for pain and visual disturbance.  Respiratory: Negative for cough, shortness of breath and wheezing.   Cardiovascular: Negative for chest pain and palpitations.  Gastrointestinal: Negative for nausea, vomiting, abdominal pain, diarrhea, constipation, blood in stool, abdominal distention and rectal pain.  Genitourinary: Negative for dysuria, hematuria and testicular pain.  Musculoskeletal: Negative for joint swelling and arthralgias.  Skin: Negative for rash.  Neurological: Negative for dizziness, syncope and headaches.  Hematological: Negative for adenopathy.  Psychiatric/Behavioral: Negative for confusion and dysphoric mood.       Objective:   Physical Exam  Constitutional: He is oriented to person, place, and time. He appears well-developed and  well-nourished. No distress.  HENT:  Head: Normocephalic and atraumatic.  Right Ear: External ear normal.  Left Ear: External ear normal.  Mouth/Throat: Oropharynx is clear and moist.  Eyes: Conjunctivae and EOM are normal. Pupils are equal, round, and reactive to light.  Neck: Normal range of motion. Neck supple. No thyromegaly present.  Cardiovascular: Normal rate, regular rhythm and normal heart sounds.   No murmur heard. Pulmonary/Chest: No respiratory distress. He has no wheezes. He has no rales.  Abdominal: Soft. Bowel sounds are normal. He exhibits no distension and no mass. There is no tenderness. There is no rebound and no guarding.  Genitourinary: Rectum normal and prostate normal.  Musculoskeletal: He exhibits no edema.  Lymphadenopathy:    He has no cervical adenopathy.  Neurological: He is alert and oriented to person, place, and time. He displays normal reflexes. No cranial nerve deficit.  Skin: No rash noted.  Psychiatric: He has a normal mood and affect. His behavior is normal. Judgment and thought content normal.          Assessment & Plan:  Complete physical. Obtain screening lab work. Patient given names of a couple of therapists to discuss stress management with.  Continue consistent exercise.

## 2011-06-18 NOTE — Progress Notes (Signed)
Quick Note:  Labs mailed to pt home upon request at [facility] ______

## 2011-08-22 IMAGING — CR DG RIBS W/ CHEST 3+V*R*
3 series · 3 of 3 positions shown · non-contrast
Comparison: None.

CLINICAL DATA: Blunt trauma.  Right anterior and inferior rib pain.

RIGHT RIBS AND CHEST - 3+ VIEW

[w chest pa]
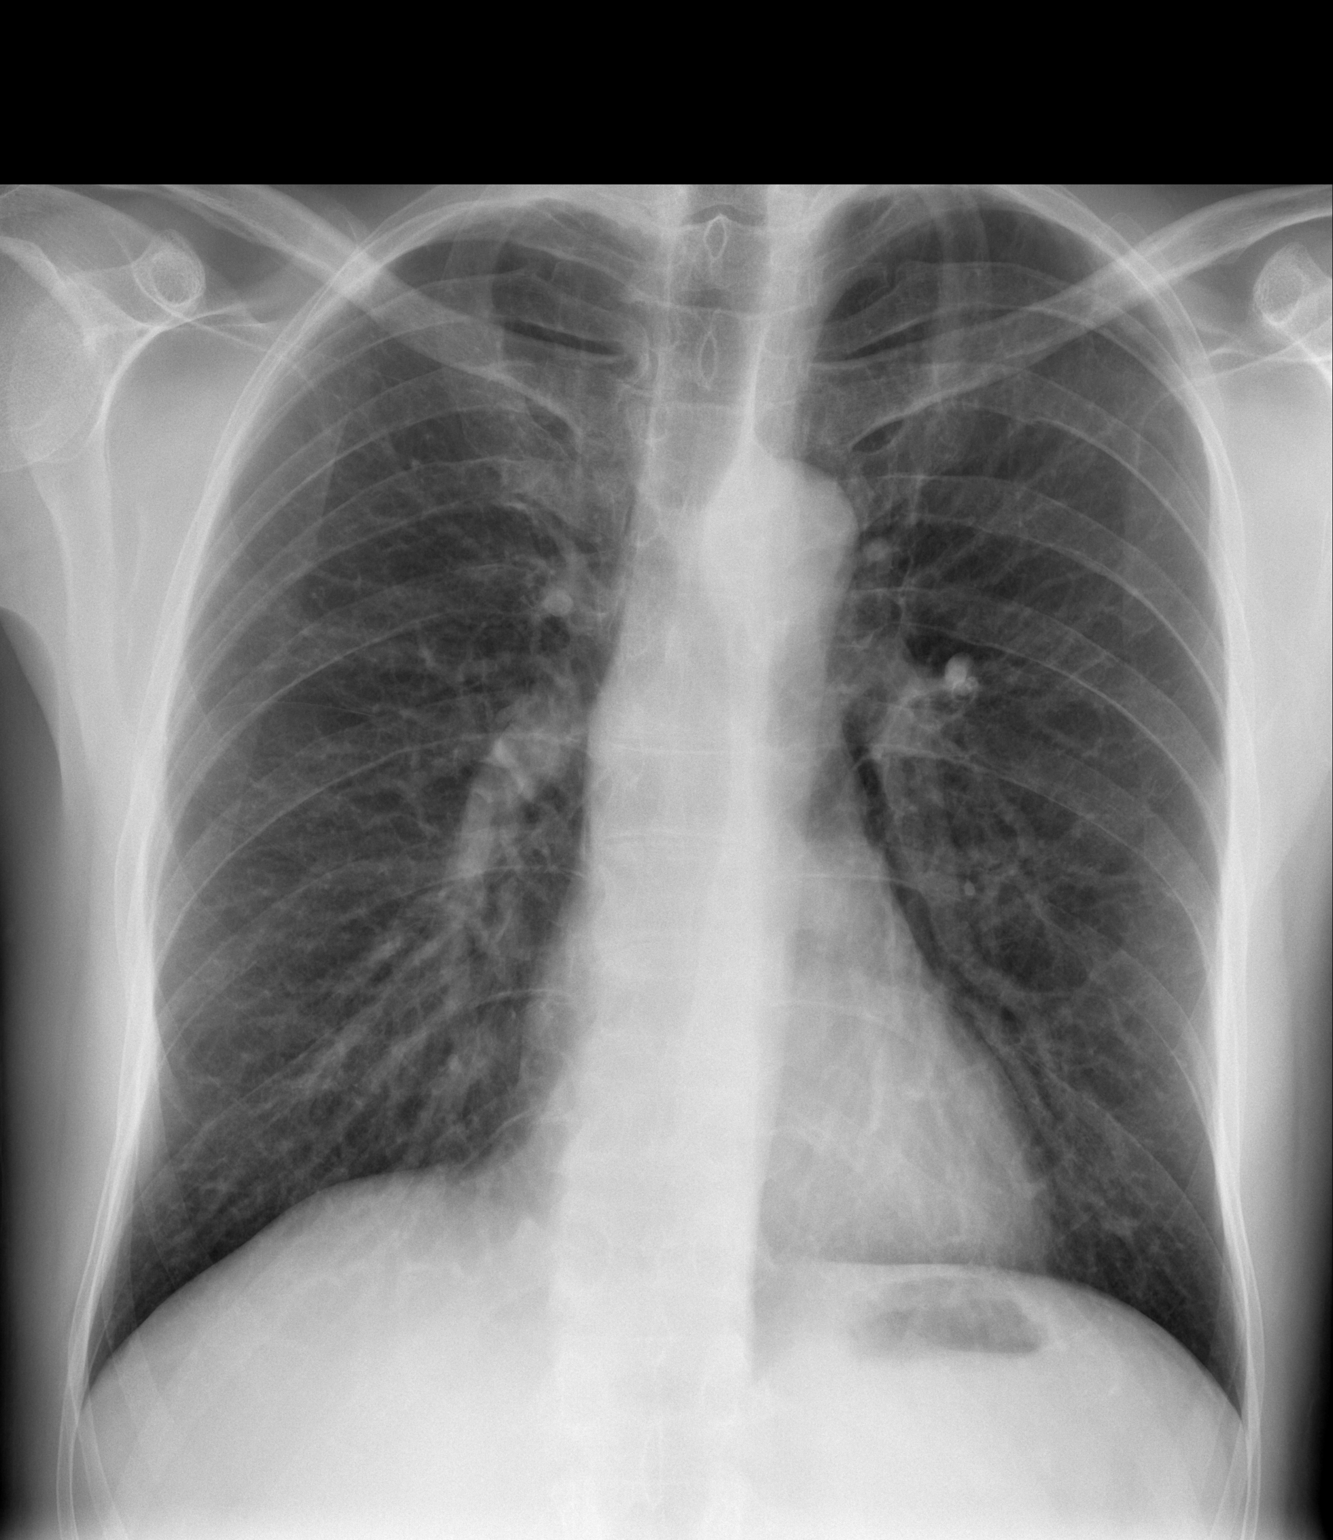

[w ribs ap/pa upper right]
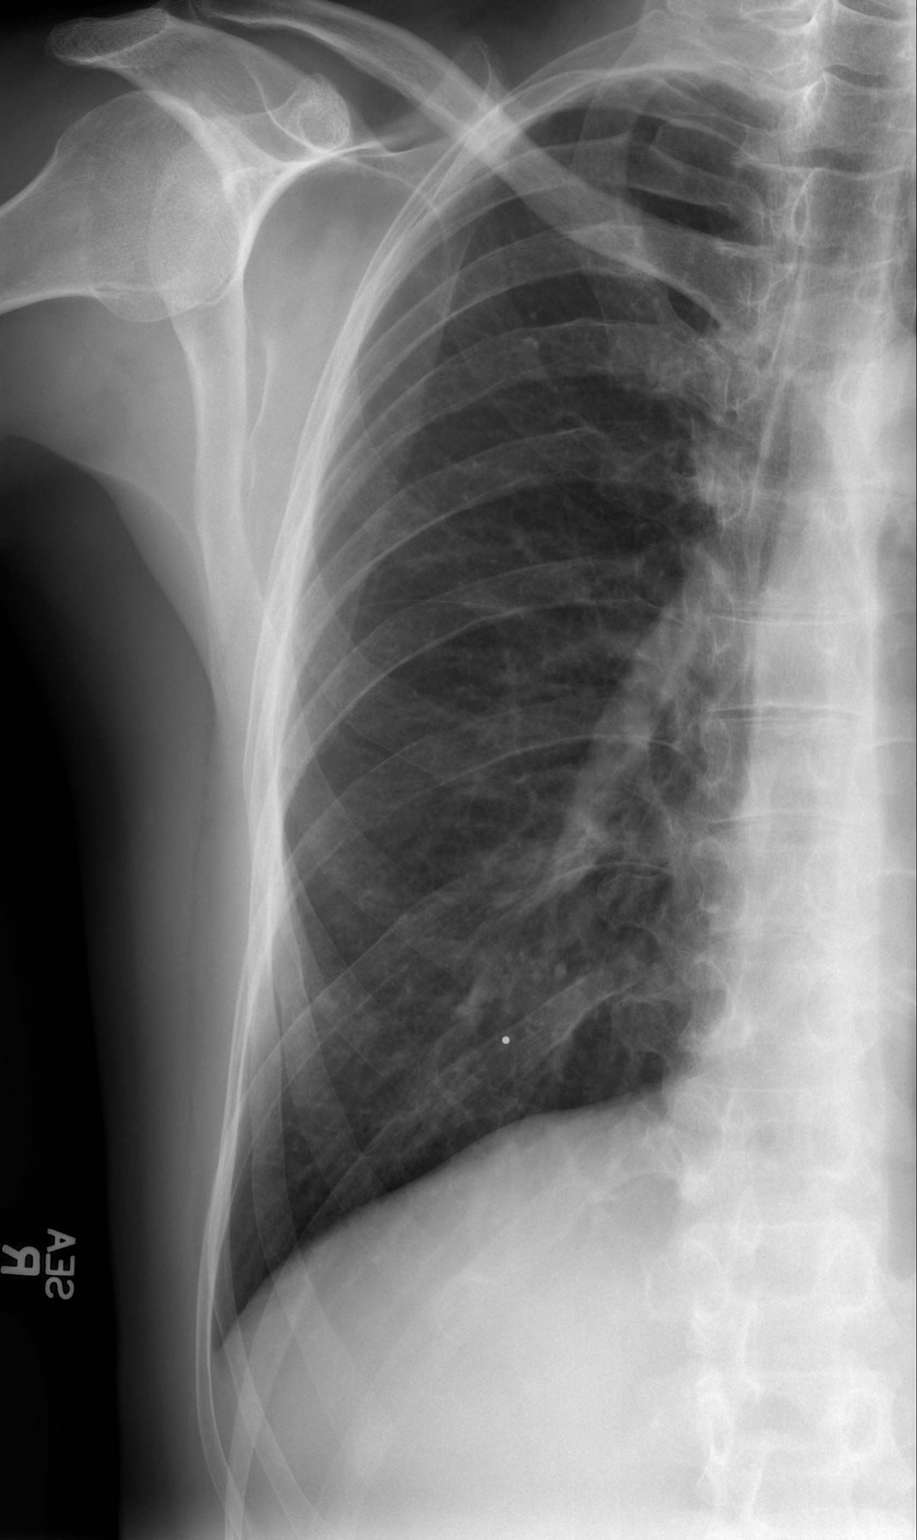

[w ribs oblique right]
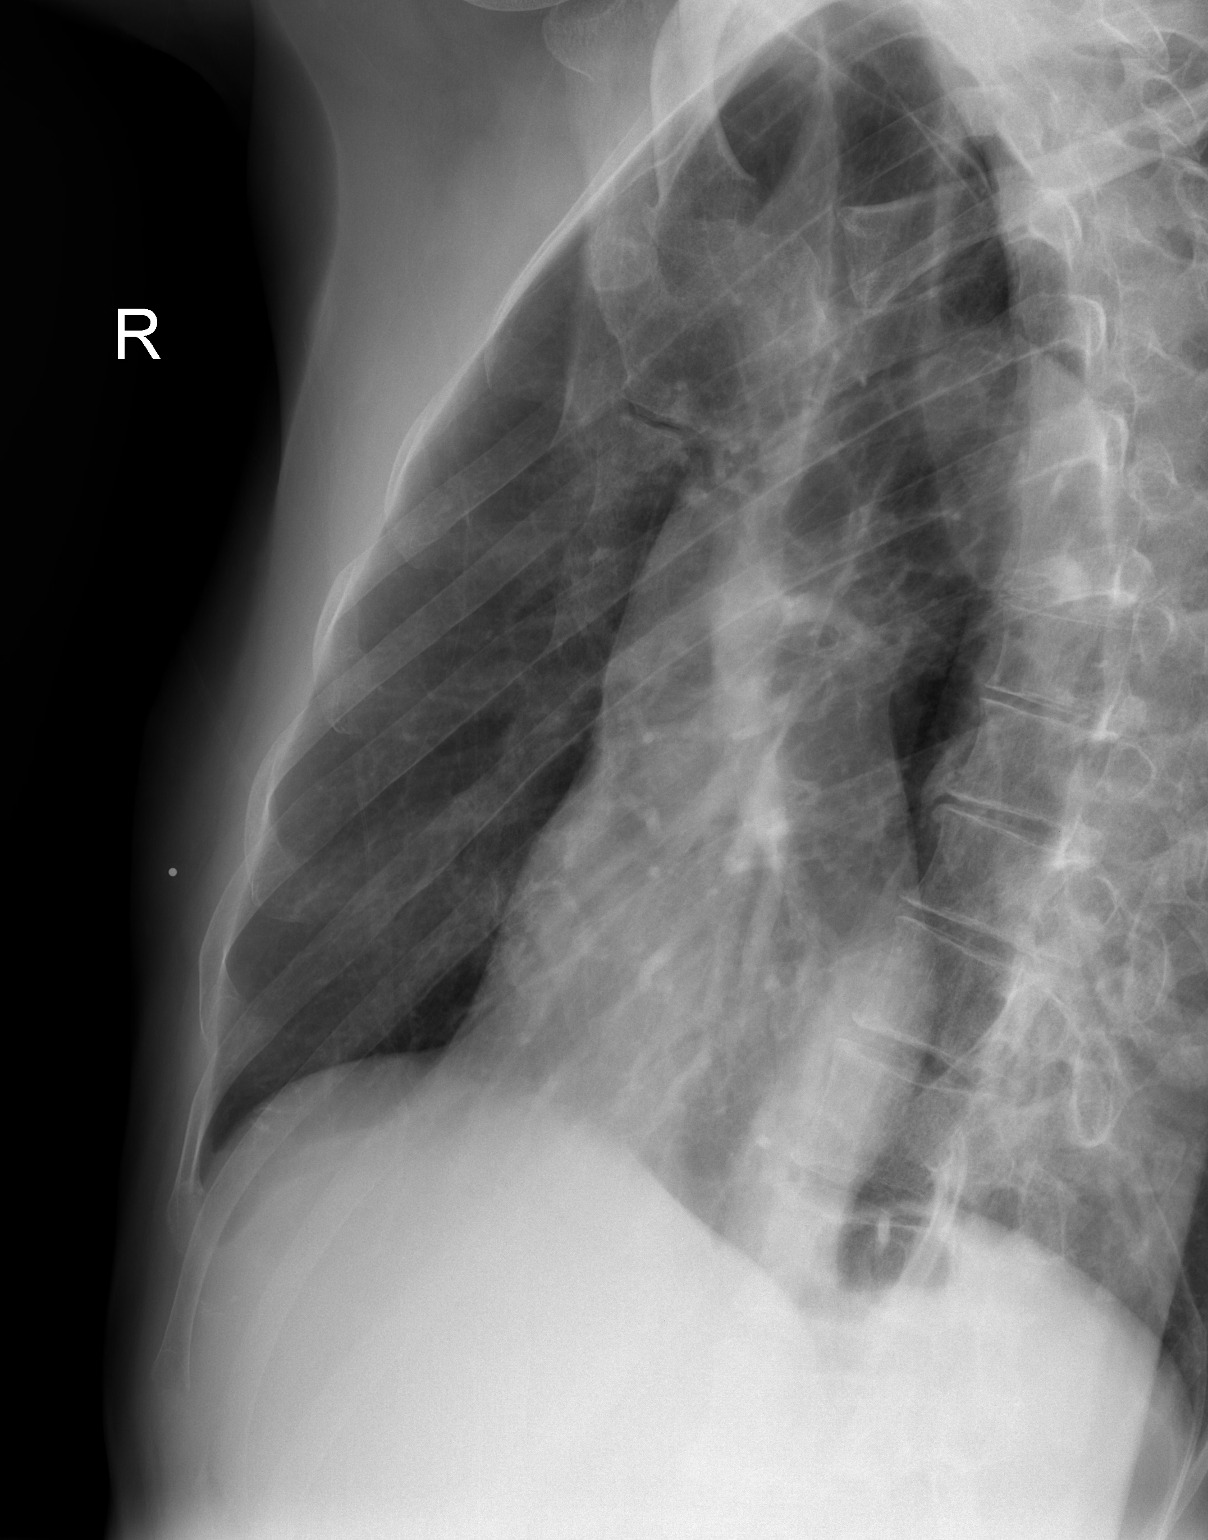

[3 of 3 positions shown; findings below may reference images not displayed]

FINDINGS: The lungs are hyperexpanded.  No confluent airspace
opacities, pleural effusions or pneumothoraces are present.  The
heart is normal in size.  The upper abdomen is normal.  No
displaced rib fractures are seen.
IMPRESSION: Hyperexpansion.  No displaced rib fractures.

## 2011-08-22 IMAGING — CR DG HAND COMPLETE 3+V*L*
3 series · 3 of 3 positions shown · non-contrast
Comparison: None.

CLINICAL DATA: Kicked by horse

LEFT HAND - COMPLETE 3+ VIEW

[x hand pa left]
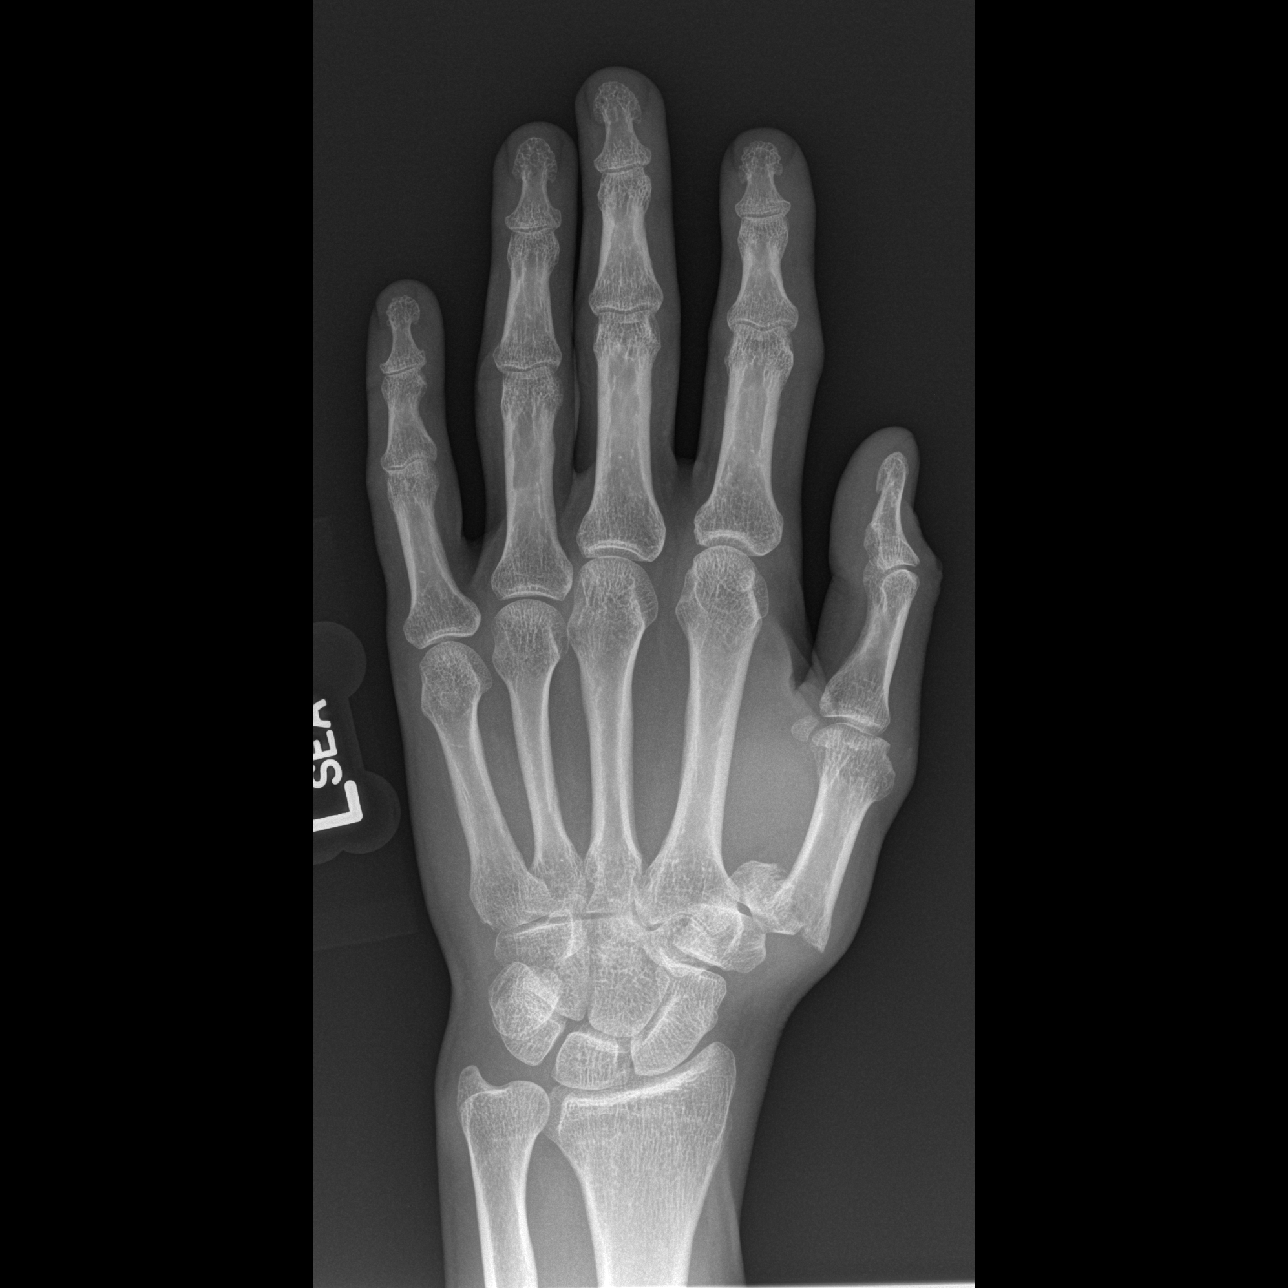

[x hand oblique left]
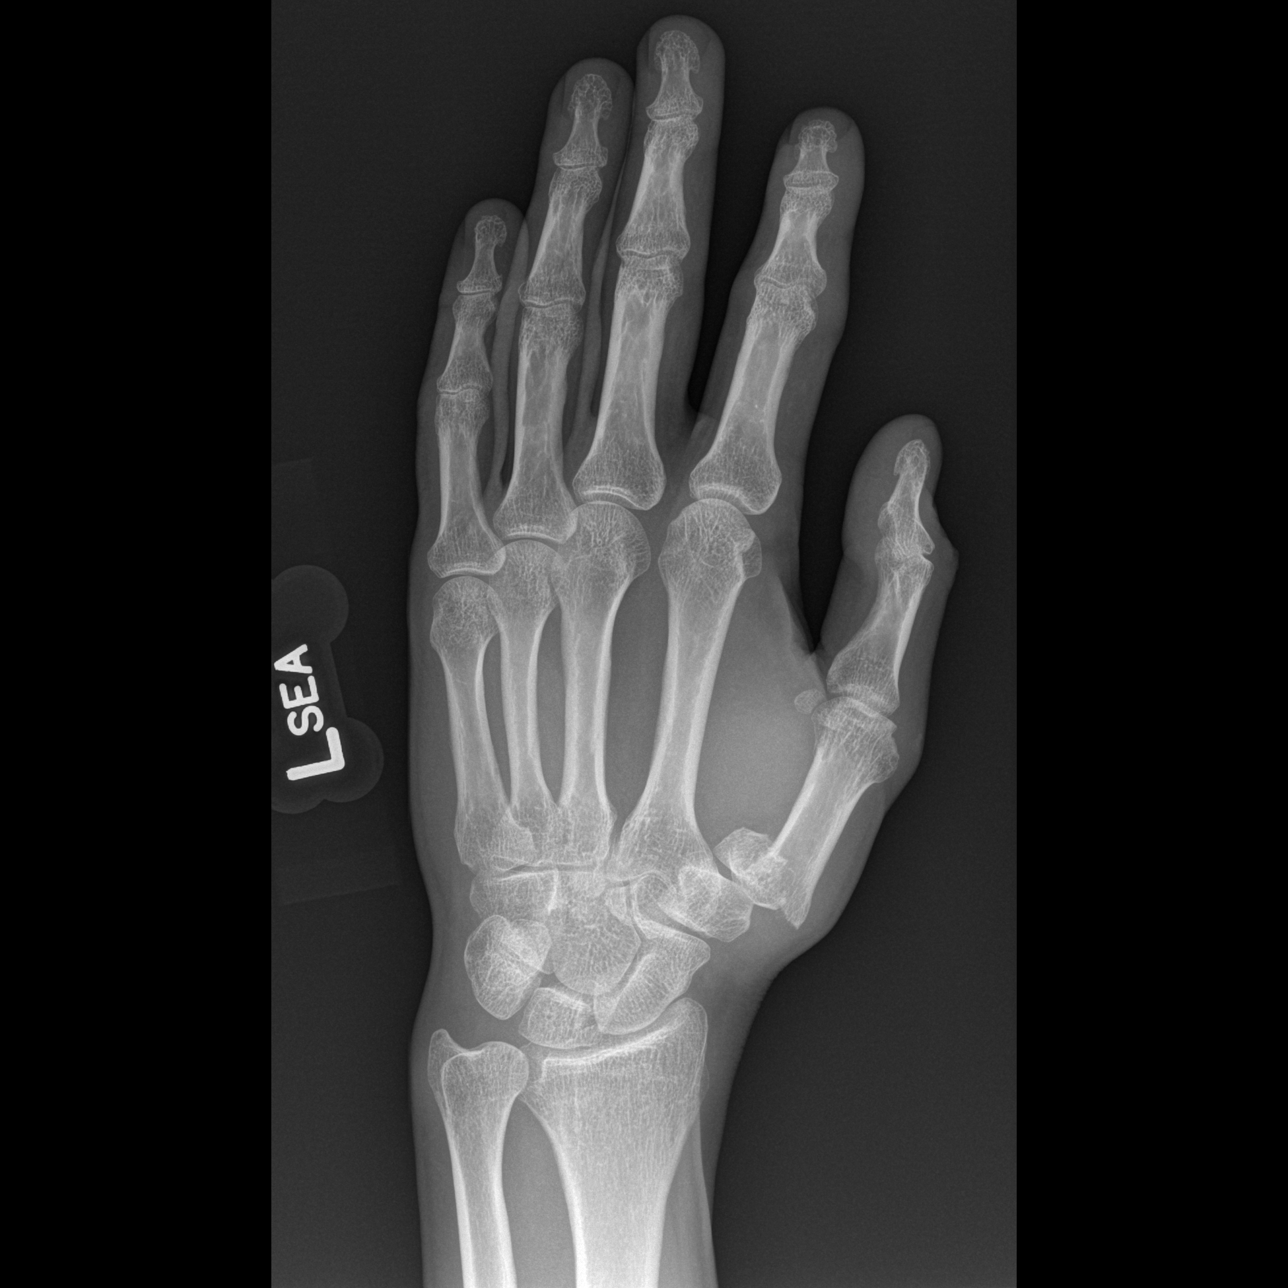

[x hand lat left]
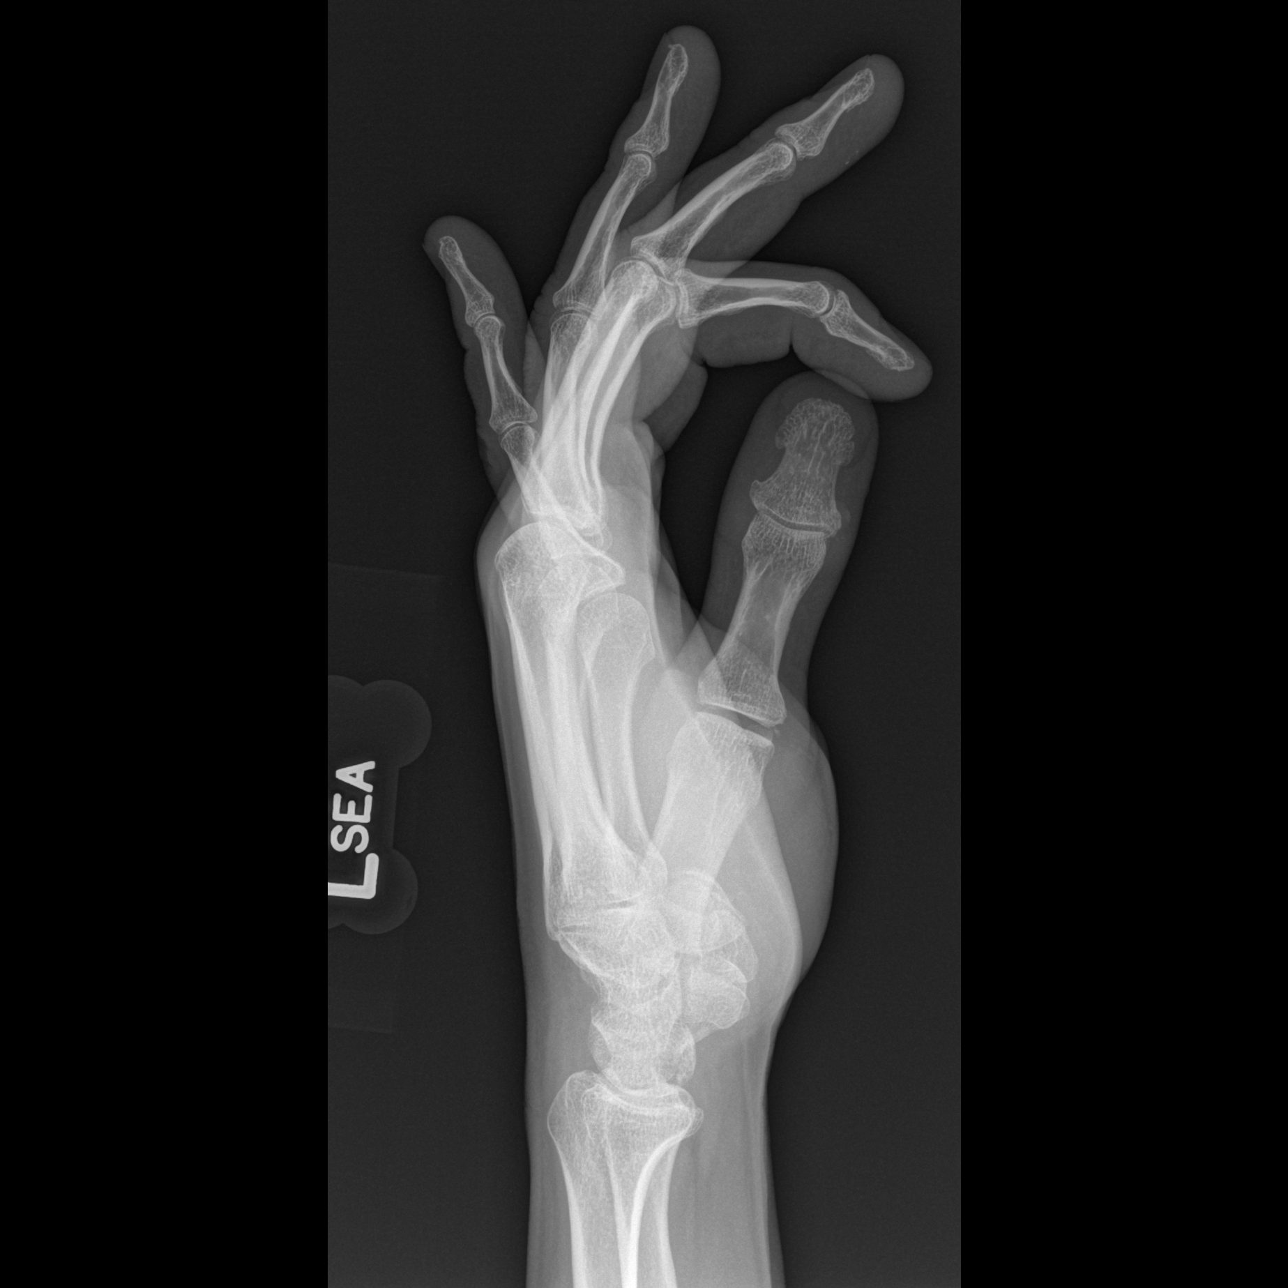

[3 of 3 positions shown; findings below may reference images not displayed]

FINDINGS: Horizontally oriented, displaced fracture is noted at the
base of the first metacarpal with approximately 8 mm of radial
displacement of the distal fracture fragment. There is no extension
to the articular surface.  Overlying soft tissue swelling is seen.
There are no other acute fractures identified.
IMPRESSION: Displaced fracture at the base of the first metacarpal detailed
above.

## 2011-11-25 ENCOUNTER — Other Ambulatory Visit: Payer: Self-pay | Admitting: Family Medicine

## 2011-11-25 NOTE — Telephone Encounter (Signed)
ambien last filled #90 with 0 refills on 04/18/11 Pt had CPX 05/2011

## 2011-11-25 NOTE — Telephone Encounter (Signed)
Pt needs generic ambien 10mg  #45 with 2 refills call into [facility] [phone]

## 2011-11-25 NOTE — Telephone Encounter (Signed)
Refill OK

## 2011-11-26 MED ORDER — ZOLPIDEM TARTRATE 10 MG PO TABS: ORAL_TABLET | ORAL | Status: DC

## 2011-11-26 NOTE — Telephone Encounter (Signed)
Rx printed and faxed.  

## 2012-05-26 ENCOUNTER — Other Ambulatory Visit (INDEPENDENT_AMBULATORY_CARE_PROVIDER_SITE_OTHER): Payer: BC Managed Care – PPO

## 2012-05-26 DIAGNOSIS — Z0000 Encounter for general adult medical examination without abnormal findings: Secondary | ICD-10-CM

## 2012-05-26 LAB — CBC WITH DIFFERENTIAL/PLATELET
Basophils Absolute: 0 10*3/uL (ref 0.0–0.1)
Eosinophils Relative: 2 % (ref 0.0–5.0)
HCT: 42.4 % (ref 39.0–52.0)
Lymphocytes Relative: 45.8 % (ref 12.0–46.0)
Monocytes Relative: 7.8 % (ref 3.0–12.0)
Neutrophils Relative %: 44 % (ref 43.0–77.0)
Platelets: 192 10*3/uL (ref 150.0–400.0)
WBC: 3.7 10*3/uL — ABNORMAL LOW (ref 4.5–10.5)

## 2012-05-26 LAB — BASIC METABOLIC PANEL
BUN: 15 mg/dL (ref 6–23)
Calcium: 9.2 mg/dL (ref 8.4–10.5)
Creatinine, Ser: 0.7 mg/dL (ref 0.4–1.5)
GFR: 119.96 mL/min (ref >60.00)
Potassium: 4.5 mEq/L (ref 3.5–5.1)

## 2012-05-26 LAB — LIPID PANEL
Cholesterol: 157 mg/dL (ref 0–200)
HDL: 44.1 mg/dL (ref >39.00)
LDL Cholesterol: 104 mg/dL — ABNORMAL HIGH (ref 0–99)
Total CHOL/HDL Ratio: 4
Triglycerides: 47 mg/dL (ref 0.0–149.0)

## 2012-05-26 LAB — POCT URINALYSIS DIPSTICK
Bilirubin, UA: NEGATIVE
Blood, UA: NEGATIVE
Glucose, UA: NEGATIVE
Nitrite, UA: NEGATIVE
Spec Grav, UA: 1.015

## 2012-05-26 LAB — HEPATIC FUNCTION PANEL
ALT: 24 U/L (ref 0–53)
AST: 18 U/L (ref 0–37)
Bilirubin, Direct: 0.1 mg/dL (ref 0.0–0.3)
Total Bilirubin: 1 mg/dL (ref 0.3–1.2)

## 2012-06-17 ENCOUNTER — Ambulatory Visit (INDEPENDENT_AMBULATORY_CARE_PROVIDER_SITE_OTHER): Payer: BC Managed Care – PPO | Admitting: Family Medicine

## 2012-06-17 ENCOUNTER — Encounter: Payer: Self-pay | Admitting: Family Medicine

## 2012-06-17 VITALS — BP 130/72 | HR 60 | Temp 98.2°F | Resp 12 | Ht 77.25 in | Wt 200.0 lb

## 2012-06-17 DIAGNOSIS — Z0000 Encounter for general adult medical examination without abnormal findings: Secondary | ICD-10-CM

## 2012-06-17 MED ORDER — NAFTIFINE HCL 1 % EX CREA: TOPICAL_CREAM | Freq: Every day | CUTANEOUS | Status: AC

## 2012-06-17 MED ORDER — ATORVASTATIN CALCIUM 10 MG PO TABS: 10.0000 mg | ORAL_TABLET | Freq: Every day | ORAL | Status: DC

## 2012-06-17 NOTE — Progress Notes (Signed)
  Subjective:    Patient ID: Barry King, male    DOB: 25-Apr-1961, 51 y.o.   MRN: [id number]  HPI  Patient here for complete physical. He has history of mild hyperlipidemia treated with atorvastatin. No other chronic medical problems. Works as an Engineer, manufacturing systems. Frequently travels. Has had flu vaccine. Exercising regularly. Overall feels well. Nonsmoker. No recent chest pains or any other complaints. Recent colonoscopy. Tetanus up-to-date.  has noted slightly hyperpigmented nonscaly well-demarcated rash left groin region the past several months. Asymptomatic  Past Medical History  Diagnosis Date  . HYPERLIPIDEMIA 11/28/2008  . 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 Shoulder arthroscopy 2009  . Vasectomy   . Heel spur surgery     reports that he has never smoked. He does not have any smokeless tobacco history on file. His alcohol and drug histories not on file. family history includes Cancer in his father and mother; Heart disease in his father; and Stroke (age of onset:85) in his mother. No Known Allergies    Review of Systems  Constitutional: Negative for fever, activity change, appetite change, fatigue and unexpected weight change.  HENT: Negative for ear pain, congestion and trouble swallowing.   Eyes: Negative for pain and visual disturbance.  Respiratory: Negative for cough, shortness of breath and wheezing.   Cardiovascular: Negative for chest pain and palpitations.  Gastrointestinal: Negative for nausea, vomiting, abdominal pain, diarrhea, constipation, blood in stool, abdominal distention and rectal pain.  Genitourinary: Negative for dysuria, hematuria and testicular pain.  Musculoskeletal: Negative for joint swelling and arthralgias.       Patient has some mild left shoulder stiffness following his surgery couple years ago. He plans to do some home range of motion exercises and  consider physical therapy if not improving  Skin: Positive for rash.  Neurological: Negative for dizziness, syncope and headaches.  Hematological: Negative for adenopathy.  Psychiatric/Behavioral: Negative for confusion and dysphoric mood.       Objective:   Physical Exam  Constitutional: He is oriented to person, place, and time. He appears well-developed and well-nourished. No distress.  HENT:  Head: Normocephalic and atraumatic.  Right Ear: External ear normal.  Left Ear: External ear normal.  Mouth/Throat: Oropharynx is clear and moist.  Eyes: Conjunctivae normal and EOM are normal. Pupils are equal, round, and reactive to light.  Neck: Normal range of motion. Neck supple. No thyromegaly present.  Cardiovascular: Normal rate, regular rhythm and normal heart sounds.   No murmur heard. Pulmonary/Chest: No respiratory distress. He has no wheezes. He has no rales.  Abdominal: Soft. Bowel sounds are normal. He exhibits no distension and no mass. There is no tenderness. There is no rebound and no guarding.  Genitourinary: Rectum normal and prostate normal.  Musculoskeletal: He exhibits no edema.  Lymphadenopathy:    He has no cervical adenopathy.  Neurological: He is alert and oriented to person, place, and time. He displays normal reflexes. No cranial nerve deficit.  Skin: Rash noted.       No concerning skin lesions Slightly hyperpigmented well-demarcated nonscaly rash left groin region  Psychiatric: He has a normal mood and affect.          Assessment & Plan:  Complete physical. Labs reviewed with patient with no concerning abnormalities. Continue low-dose Lipitor with refills given. Continue regular exercise. Tetanus up-to-date. Colonoscopy up to date  Probable tinea cruris involving left groin. Naftin cream twice daily as needed

## 2012-10-03 ENCOUNTER — Other Ambulatory Visit: Payer: Self-pay

## 2012-10-30 ENCOUNTER — Telehealth: Payer: Self-pay | Admitting: Family Medicine

## 2012-10-30 MED ORDER — ZOLPIDEM TARTRATE 10 MG PO TABS: ORAL_TABLET | ORAL | Status: DC

## 2012-10-30 NOTE — Telephone Encounter (Signed)
Refill OK

## 2012-10-30 NOTE — Telephone Encounter (Signed)
Script was printed and faxed. 

## 2012-10-30 NOTE — Telephone Encounter (Signed)
Pt needs new rx zolpidem 10 mg #45 with [facility]ills [facility]

## 2012-11-11 ENCOUNTER — Telehealth: Payer: Self-pay | Admitting: *Deleted

## 2012-11-11 MED ORDER — ZOLPIDEM TARTRATE 10 MG PO TABS: ORAL_TABLET | ORAL | Status: DC

## 2012-11-11 NOTE — Telephone Encounter (Signed)
VM received from pt explaining the Ambien for international flights, #45 with 0 refills on 10-30-12 was denied by [facility].  His insurance will only allow #30 pill at a time.  Pt requesting a new Rx for 30 be faxed to [facility].

## 2012-11-11 NOTE — Telephone Encounter (Signed)
Pt informed Rx will be faxed as requested

## 2012-11-11 NOTE — Telephone Encounter (Signed)
OK to send.

## 2013-05-06 ENCOUNTER — Ambulatory Visit (INDEPENDENT_AMBULATORY_CARE_PROVIDER_SITE_OTHER): Payer: BC Managed Care – PPO | Admitting: Family Medicine

## 2013-05-06 ENCOUNTER — Encounter: Payer: Self-pay | Admitting: Family Medicine

## 2013-05-06 VITALS — BP 140/80 | HR 64 | Temp 98.3°F | Wt 205.0 lb

## 2013-05-06 DIAGNOSIS — N529 Male erectile dysfunction, unspecified: Secondary | ICD-10-CM

## 2013-05-06 DIAGNOSIS — L738 Other specified follicular disorders: Secondary | ICD-10-CM

## 2013-05-06 DIAGNOSIS — L739 Follicular disorder, unspecified: Secondary | ICD-10-CM

## 2013-05-06 MED ORDER — DOXYCYCLINE HYCLATE 100 MG PO CAPS: 100.0000 mg | ORAL_CAPSULE | Freq: Two times a day (BID) | ORAL | Status: AC

## 2013-05-06 MED ORDER — SILDENAFIL CITRATE 100 MG PO TABS: 100.0000 mg | ORAL_TABLET | Freq: Every day | ORAL | Status: DC | PRN

## 2013-05-06 NOTE — Progress Notes (Signed)
  Subjective:    Patient ID: Barry King, male    DOB: 1960-12-02, 52 y.o.   MRN: [id number]  HPI Patient seen for the following issues  Skin rash right trunk region. Nonpainful. Nonpruritic. He has a few scattered papules. No fevers or chills Onset over one week ago.  Erectile dysfunction also noted are the past few months. He thinks and this may be stress related. Possibly fatigue related. He does not take any medications which would be a culprit. No excessive alcohol use. Nonsmoker. No history of peripheral vascular disease. Good libido.    Past Medical History  Diagnosis Date  . HYPERLIPIDEMIA 11/28/2008  . 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 Shoulder arthroscopy  2009  . Vasectomy    . Heel spur surgery      reports that he has never smoked. He does not have any smokeless tobacco history on file. His alcohol and drug histories are not on file. family history includes Cancer in his father and mother; Heart disease in his father; Stroke (age of onset: 30) in his mother. No Known Allergies   Review of Systems  Constitutional: Negative for fatigue.  Eyes: Negative for visual disturbance.  Respiratory: Negative for cough, chest tightness and shortness of breath.   Cardiovascular: Negative for chest pain, palpitations and leg swelling.  Skin: Positive for rash.  Neurological: Negative for dizziness, syncope, weakness, light-headedness and headaches.       Objective:   Physical Exam  Constitutional: He appears well-developed and well-nourished.  Cardiovascular: Normal rate and regular rhythm.   Pulmonary/Chest: Effort normal and breath sounds normal. No respiratory distress. He has no wheezes. He has no rales.  Skin: Rash noted.  Nonspecific papular rash right trunk region. Couple of small light yellow pustules. Nontender          Assessment & Plan:  #1 erectile dysfunction. Trial of  Viagra. 100 mg one half to one tablet daily as needed #2 skin rash. Follicular. Nonspecific. Reassurance. Observe for now. If progressing start doxycycline 100 mg twice a day for 10 days

## 2013-06-26 ENCOUNTER — Other Ambulatory Visit: Payer: Self-pay | Admitting: Family Medicine

## 2013-07-23 ENCOUNTER — Telehealth: Payer: Self-pay | Admitting: Family Medicine

## 2013-07-23 NOTE — Telephone Encounter (Signed)
Pt [facility]. Pt would like new rx for viagra with the maximum pills he can get. Pt will call his INS or pharm for the maximum allowed. Pt also needs zolpidem 10 mg #30 with 3 refills [facility]

## 2013-07-23 NOTE — Telephone Encounter (Signed)
Zolpidem  refill 90 days viagra 18 pills /90 days

## 2013-07-26 MED ORDER — ZOLPIDEM TARTRATE 10 MG PO TABS: ORAL_TABLET | ORAL | Status: DC

## 2013-07-26 MED ORDER — SILDENAFIL CITRATE 100 MG PO TABS: 100.0000 mg | ORAL_TABLET | Freq: Every day | ORAL | Status: AC | PRN

## 2013-07-26 NOTE — Telephone Encounter (Signed)
Last visit 05/06/13  Last refill Zolpidem #30 1 refill 11/11/12 Last refill Viagra #4 11 refill 05/06/13

## 2013-07-26 NOTE — Telephone Encounter (Signed)
[facility]ed [facility]  

## 2013-07-26 NOTE — Telephone Encounter (Signed)
OK to refill both 

## 2013-12-29 ENCOUNTER — Telehealth: Payer: Self-pay | Admitting: Family Medicine

## 2013-12-29 DIAGNOSIS — E785 Hyperlipidemia, unspecified: Secondary | ICD-10-CM

## 2013-12-29 NOTE — Telephone Encounter (Signed)
Pt needs labs done for his work requirements. Pt just moved to [place] last week for his job and is still up here some. Pt sent me a copy of the labs which I put in your folder. Pt would like, to know if he can sch these next friday week?

## 2013-12-30 NOTE — Telephone Encounter (Signed)
Please be more specific.  What do you mean by "blood count"?

## 2013-12-30 NOTE — Telephone Encounter (Signed)
His job wants these labs drawn: blood count, thyroid, hepatic, lipid, BMP, iron, CBC w/diff, Vitamin D.

## 2013-12-30 NOTE — Telephone Encounter (Signed)
Blood count would CBC w/diff. Free T3 Free T4, Ferritin, Transferritin, Uric Acid, Urea, Antixodants, Zinc, Selenium, sensitive CRP, Omega 3/6.

## 2013-12-31 NOTE — Telephone Encounter (Signed)
OK to order 

## 2014-01-04 ENCOUNTER — Other Ambulatory Visit: Payer: Self-pay | Admitting: Family Medicine

## 2014-01-04 DIAGNOSIS — E785 Hyperlipidemia, unspecified: Secondary | ICD-10-CM

## 2014-01-04 NOTE — Telephone Encounter (Signed)
Pt is in [place] right now and states he will be here on Friday morning, and needs the appt for the labs on Friday morning if at all possible.Marland Kitchen

## 2014-01-04 NOTE — Telephone Encounter (Signed)
lmovm for pt to cb and sch labs

## 2014-01-04 NOTE — Telephone Encounter (Signed)
Yes he can set up appt for labs

## 2014-01-07 ENCOUNTER — Other Ambulatory Visit (INDEPENDENT_AMBULATORY_CARE_PROVIDER_SITE_OTHER): Payer: BC Managed Care – PPO

## 2014-01-07 DIAGNOSIS — E785 Hyperlipidemia, unspecified: Secondary | ICD-10-CM

## 2014-01-07 LAB — BASIC METABOLIC PANEL
BUN: 14 mg/dL (ref 6–23)
CHLORIDE: 105 meq/L (ref 96–112)
CO2: 28 mEq/L (ref 19–32)
Calcium: 9.4 mg/dL (ref 8.4–10.5)
Creatinine, Ser: 0.7 mg/dL (ref 0.4–1.5)
GFR: 133.92 mL/min (ref >60.00)
GLUCOSE: 99 mg/dL (ref 70–99)
POTASSIUM: 4.3 meq/L (ref 3.5–5.1)
Sodium: 140 mEq/L (ref 135–145)

## 2014-01-07 LAB — T3, FREE: T3 FREE: 3.3 pg/mL (ref 2.3–4.2)

## 2014-01-07 LAB — FERRITIN: FERRITIN: 137.7 ng/mL (ref 22.0–322.0)

## 2014-01-07 LAB — LIPID PANEL
CHOLESTEROL: 145 mg/dL (ref 0–200)
HDL: 41 mg/dL (ref >39.00)
LDL Cholesterol: 93 mg/dL (ref 0–99)
Total CHOL/HDL Ratio: 4
Triglycerides: 57 mg/dL (ref 0.0–149.0)
VLDL: 11.4 mg/dL (ref 0.0–40.0)

## 2014-01-07 LAB — HEPATIC FUNCTION PANEL
ALT: 20 U/L (ref 0–53)
AST: 17 U/L (ref 0–37)
Albumin: 4 g/dL (ref 3.5–5.2)
Alkaline Phosphatase: 39 U/L (ref 39–117)
Bilirubin, Direct: 0.1 mg/dL (ref 0.0–0.3)
Total Bilirubin: 0.8 mg/dL (ref 0.2–1.2)
Total Protein: 6.4 g/dL (ref 6.0–8.3)

## 2014-01-07 LAB — TSH: TSH: 1.15 u[IU]/mL (ref 0.35–4.50)

## 2014-01-07 LAB — CBC WITH DIFFERENTIAL/PLATELET
BASOS ABS: 0 10*3/uL (ref 0.0–0.1)
Basophils Relative: 0.3 % (ref 0.0–3.0)
Eosinophils Absolute: 0.2 10*3/uL (ref 0.0–0.7)
Eosinophils Relative: 4.1 % (ref 0.0–5.0)
HCT: 42.4 % (ref 39.0–52.0)
Hemoglobin: 14.5 g/dL (ref 13.0–17.0)
LYMPHS PCT: 45.3 % (ref 12.0–46.0)
Lymphs Abs: 1.7 10*3/uL (ref 0.7–4.0)
MCHC: 34.2 g/dL (ref 30.0–36.0)
MCV: 89.1 fl (ref 78.0–100.0)
Monocytes Absolute: 0.3 10*3/uL (ref 0.1–1.0)
Monocytes Relative: 6.8 % (ref 3.0–12.0)
NEUTROS PCT: 43.5 % (ref 43.0–77.0)
Neutro Abs: 1.6 10*3/uL (ref 1.4–7.7)
Platelets: 187 10*3/uL (ref 150.0–400.0)
RBC: 4.76 Mil/uL (ref 4.22–5.81)
RDW: 13.6 % (ref 11.5–15.5)
WBC: 3.8 10*3/uL — ABNORMAL LOW (ref 4.0–10.5)

## 2014-01-07 LAB — T4, FREE: FREE T4: 0.84 ng/dL (ref 0.60–1.60)

## 2014-01-07 LAB — HIGH SENSITIVITY CRP

## 2014-01-07 LAB — URIC ACID: Uric Acid, Serum: 3.9 mg/dL — ABNORMAL LOW (ref 4.0–7.8)

## 2014-01-07 LAB — BUN: BUN: 14 mg/dL (ref 6–23)

## 2014-01-08 LAB — VITAMIN D 25 HYDROXY (VIT D DEFICIENCY, FRACTURES): Vit D, 25-Hydroxy: 46 ng/mL (ref 30–89)

## 2014-01-11 LAB — SELENIUM SERUM: Selenium, Blood: 147 mcg/L (ref 63–160)

## 2014-01-12 LAB — IBC PANEL
Iron: 93 ug/dL (ref 42–165)
SATURATION RATIOS: 27.4 % (ref 20.0–50.0)
TRANSFERRIN: 242 mg/dL (ref 212.0–360.0)

## 2014-01-12 NOTE — Addendum Note (Signed)
Addended by: Joyce Gross R on: 01/12/2014 11:42 AM   Modules accepted: Orders

## 2014-03-01 ENCOUNTER — Telehealth: Payer: Self-pay | Admitting: Family Medicine

## 2014-03-01 NOTE — Telephone Encounter (Signed)
Pt req rx on zolpidem (AMBIEN) 10 MG tablet  And need this [facility]   Pt req 90 supply

## 2014-03-02 MED ORDER — ZOLPIDEM TARTRATE 10 MG PO TABS: ORAL_TABLET | ORAL | Status: AC

## 2014-03-02 NOTE — Telephone Encounter (Signed)
Last visit 05/06/13 Last refill 07/26/13 #90 0 refill

## 2014-03-02 NOTE — Telephone Encounter (Signed)
[facility]ed [facility]

## 2014-03-02 NOTE — Telephone Encounter (Signed)
OK to refill once.   

## 2014-05-29 ENCOUNTER — Other Ambulatory Visit: Payer: Self-pay | Admitting: Family Medicine

## 2014-06-07 ENCOUNTER — Telehealth: Payer: Self-pay | Admitting: Family Medicine

## 2014-06-07 MED ORDER — ATORVASTATIN CALCIUM 10 MG PO TABS: ORAL_TABLET | ORAL | Status: AC

## 2014-06-07 NOTE — Telephone Encounter (Signed)
Pt's re-fill request was denied on 05/30/14.  PA isn't required.

## 2014-06-07 NOTE — Telephone Encounter (Signed)
Can you do a PA?

## 2014-06-07 NOTE — Telephone Encounter (Signed)
Sent in Rx patient does need a follow up visit.

## 2014-06-07 NOTE — Telephone Encounter (Signed)
Pt chole med was denied. Pt lives in [place] now. Pt stated he had blood work done in may 2015

## 2014-08-27 ENCOUNTER — Other Ambulatory Visit: Payer: Self-pay | Admitting: Family Medicine

## 2014-09-05 ENCOUNTER — Telehealth: Payer: Self-pay | Admitting: Family Medicine

## 2014-09-05 NOTE — Telephone Encounter (Signed)
Pt was last seen 2014. Pt would like a refill on ambien 10 mg. Pt has [facility]

## 2014-09-05 NOTE — Telephone Encounter (Signed)
Pt is aware. Pt is living in [place] now

## 2014-09-05 NOTE — Telephone Encounter (Signed)
No patient will need to be seen.

## 2015-02-09 ENCOUNTER — Encounter: Payer: Self-pay | Admitting: Internal Medicine

## 2015-10-24 ENCOUNTER — Encounter: Payer: Self-pay | Admitting: Internal Medicine

## 2017-05-08 ENCOUNTER — Encounter: Payer: Self-pay | Admitting: Family Medicine
